# Patient Record
Sex: Male | Born: 1975 | Race: White | Hispanic: No | Marital: Single
Health system: Southern US, Community
[De-identification: ages and names within clinical notes are randomized; demographics above are authoritative.]

## PROBLEM LIST (undated history)

## (undated) DIAGNOSIS — M1990 Unspecified osteoarthritis, unspecified site: Secondary | ICD-10-CM

## (undated) DIAGNOSIS — T7840XA Allergy, unspecified, initial encounter: Secondary | ICD-10-CM

## (undated) DIAGNOSIS — F419 Anxiety disorder, unspecified: Secondary | ICD-10-CM

## (undated) HISTORY — DX: Allergy, unspecified, initial encounter: T78.40XA

## (undated) HISTORY — DX: Anxiety disorder, unspecified: F41.9

## (undated) HISTORY — DX: Unspecified osteoarthritis, unspecified site: M19.90

---

## 2012-09-08 DIAGNOSIS — K4020 Bilateral inguinal hernia, without obstruction or gangrene, not specified as recurrent: Secondary | ICD-10-CM | POA: Insufficient documentation

## 2021-07-29 ENCOUNTER — Emergency Department (HOSPITAL_BASED_OUTPATIENT_CLINIC_OR_DEPARTMENT_OTHER)
Admission: EM | Admit: 2021-07-29 | Discharge: 2021-07-29 | Disposition: A | Discharge status: Home / Self Care | Payer: BC Managed Care – PPO | Attending: Emergency Medicine | Admitting: Emergency Medicine

## 2021-07-29 ENCOUNTER — Emergency Department (HOSPITAL_BASED_OUTPATIENT_CLINIC_OR_DEPARTMENT_OTHER): Payer: BC Managed Care – PPO

## 2021-07-29 ENCOUNTER — Other Ambulatory Visit: Payer: Self-pay

## 2021-07-29 ENCOUNTER — Encounter (HOSPITAL_BASED_OUTPATIENT_CLINIC_OR_DEPARTMENT_OTHER): Payer: Self-pay | Admitting: Emergency Medicine

## 2021-07-29 ENCOUNTER — Emergency Department (HOSPITAL_BASED_OUTPATIENT_CLINIC_OR_DEPARTMENT_OTHER): Payer: BC Managed Care – PPO | Admitting: Radiology

## 2021-07-29 DIAGNOSIS — I808 Phlebitis and thrombophlebitis of other sites: Secondary | ICD-10-CM

## 2021-07-29 DIAGNOSIS — I82712 Chronic embolism and thrombosis of superficial veins of left upper extremity: Secondary | ICD-10-CM | POA: Diagnosis not present

## 2021-07-29 DIAGNOSIS — M79602 Pain in left arm: Secondary | ICD-10-CM | POA: Diagnosis present

## 2021-07-29 NOTE — Discharge Instructions (Signed)
Continue ibuprofen, ice, and heat.  You can utilize compression wraps and elevation when possible.  The area of swelling should improve.  If it does not, you can return at any time. ?

## 2021-07-29 NOTE — ED Provider Notes (Signed)
?[facility] EMERGENCY DEPT ?Provider Note ? ? ?CSN: [id number] ?Arrival date & time: 07/29/21  1253 ? ?  ? ?History ? ?Chief Complaint  ?Patient presents with  ? Arm Pain  ? ? ?Jared Anderson is a 46 y.o. male. ? ? ?Arm Pain ?Patient presents for upper left arm pain.  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  He is in good underlying health.  3 weeks ago, he donated blood in his left AC.  He states that they did have to shift the needle around and there may have been some localized trauma during that time.  1 week ago, he noticed an area of tightness in the medial aspect of his upper left arm.  He has since noticed a area of ropelike swelling that does have associated tenderness.  He denies any fevers or shortness of breath.  He denies any other associated symptoms.  He has been utilizing warm compresses and ibuprofen at home.  He has no history of blood clots and no known family history of blood clots. ? ?  ? ?Home Medications ?Prior to Admission medications   ?Not on File  ?   ? ?Allergies    ?Patient has no known allergies.   ? ?Review of Systems   ?Review of Systems  ?Musculoskeletal:  Positive for myalgias.  ?All other systems reviewed and are negative. ? ?Physical Exam ?Updated Vital Signs ?BP (!) 135/93 (BP Location: Right Arm)   Pulse 67   Temp 97.8 ?F (36.6 ?C) (Oral)   Resp 18   Ht 6\' 2"  (1.88 m)   Wt 77.1 kg   SpO2 100%   BMI 21.83 kg/m?  ?Physical Exam ?Vitals and nursing note reviewed.  ?Constitutional:   ?   General: He is not in acute distress. ?   Appearance: Normal appearance. He is well-developed and normal weight. He is not ill-appearing, toxic-appearing or diaphoretic.  ?HENT:  ?   Head: Normocephalic and atraumatic.  ?   Right Ear: External ear normal.  ?   Left Ear: External ear normal.  ?   Nose: Nose normal.  ?   Mouth/Throat:  ?   Mouth: Mucous membranes are moist.  ?   Pharynx: Oropharynx is clear.  ?Eyes:   ?   Extraocular Movements: Extraocular movements intact.  ?   Conjunctiva/sclera: Conjunctivae normal.  ?Cardiovascular:  ?   Rate and Rhythm: Normal rate and regular rhythm.  ?   Heart sounds: No murmur heard. ?Pulmonary:  ?   Effort: Pulmonary effort is normal. No respiratory distress.  ?Abdominal:  ?   Palpations: Abdomen is soft.  ?   Tenderness: There is no abdominal tenderness.  ?Musculoskeletal:     ?   General: No swelling. Normal range of motion.  ?   Cervical back: Normal range of motion and neck supple. No rigidity.  ?   Right lower leg: No edema.  ?   Left lower leg: No edema.  ?   Comments: Palpable band in medial aspect of upper left arm, consistent with superficial thrombophlebitis.  ?Skin: ?   General: Skin is warm and dry.  ?   Capillary Refill: Capillary refill takes less than 2 seconds.  ?   Coloration: Skin is not jaundiced or pale.  ?Neurological:  ?   General: No focal deficit present.  ?   Mental Status: He is alert and oriented to person, place, and time.  ?   Cranial Nerves: No  cranial nerve deficit.  ?   Sensory: No sensory deficit.  ?   Motor: No weakness.  ?   Coordination: Coordination normal.  ?Psychiatric:     ?   Mood and Affect: Mood normal.     ?   Behavior: Behavior normal.     ?   Thought Content: Thought content normal.     ?   Judgment: Judgment normal.  ? ? ?ED Results / Procedures / Treatments   ?Labs ?(all labs ordered are listed, but only abnormal results are displayed) ?Labs Reviewed - No data to display ? ?EKG ?None ? ?Radiology ?US Venous Img Upper Left (DVT Study) ? ?Result Date: 07/29/2021 ?CLINICAL DATA:  LEFT upper arm swelling and tightness for 1 week with palpable cord at distal upper arm, donated blood from LEFT arm 3 weeks ago, thrombophlebitis question deep venous thrombosis EXAM: LEFT UPPER EXTREMITY VENOUS DOPPLER ULTRASOUND TECHNIQUE: Gray-scale sonography with graded compression, as well as color Doppler and duplex ultrasound were performed to evaluate the  upper extremity deep venous system from the level of the subclavian vein and including the jugular, axillary, basilic, radial, ulnar and upper cephalic vein. Spectral Doppler was utilized to evaluate flow at rest and with distal augmentation maneuvers. COMPARISON:  None FINDINGS: Contralateral Subclavian Vein: Respiratory phasicity is normal and symmetric with the symptomatic side. No evidence of thrombus. Normal compressibility. Internal Jugular Vein: No evidence of thrombus. Normal compressibility, respiratory phasicity and response to augmentation. Subclavian Vein: No evidence of thrombus. Normal compressibility, respiratory phasicity and response to augmentation. Axillary Vein: No evidence of thrombus. Normal compressibility, respiratory phasicity and response to augmentation. Cephalic Vein: No evidence of thrombus. Normal compressibility, respiratory phasicity and response to augmentation. Basilic Vein: No evidence of thrombus. Normal compressibility, respiratory phasicity and response to augmentation. Brachial Veins: No evidence of thrombus. Normal compressibility, respiratory phasicity and response to augmentation. Radial Veins: No evidence of thrombus. Normal compressibility, respiratory phasicity and response to augmentation. Ulnar Veins: No evidence of thrombus. Normal compressibility, respiratory phasicity and response to augmentation. Venous Reflux:  None visualized. Other Findings: A short segment of a superficial vein at the distal LEFT upper arm at the site of clinical concern demonstrates hypoechoic internal echogenicity, absence of spontaneous venous flow and impaired compressibility consistent with superficial thrombophlebitis. IMPRESSION: No evidence of DVT within the LEFT upper extremity. Short segment of superficial thrombophlebitis at the site of clinical concern at the distal LEFT upper arm. Electronically Signed   By: Ulyses Southward M.D.   On: 07/29/2021 16:47  ? ?DG Humerus Left ? ?Result Date:  07/29/2021 ?CLINICAL DATA:  Left arm pain for 1 week. EXAM: LEFT HUMERUS - 2+ VIEW COMPARISON:  None. FINDINGS: Cortical margins of the humerus are intact. There is no evidence of fracture or other focal bone lesions. Shoulder and elbow alignment are maintained. Soft tissues are unremarkable. IMPRESSION: Negative radiographs of the left humerus. Electronically Signed   By: Narda Rutherford M.D.   On: 07/29/2021 15:28   ? ?Procedures ?Procedures  ? ? ?Medications Ordered in ED ?Medications - No data to display ? ?ED Course/ Medical Decision Making/ A&P ?  ?                        ?Medical Decision Making ?Amount and/or Complexity of Data Reviewed ?Radiology: ordered. ? ? ?Patient is a healthy 46 year old male presenting for area of swelling and discomfort in his left upper arm.  First noticed 1 week ago.  3 weeks ago, he did give blood in the wrist and manipulation of his left AC needle causing some localized trauma.  Vital signs are normal on arrival.  Patient is well-appearing.  On exam, he does have a firm bandlike swelling consistent with superficial thrombophlebitis.  There is no overlying change in coloration of his skin.  Area is minimally tender.  He denies any other areas of discomfort or any shortness of breath.  Differential diagnosis includes superficial thrombophlebitis, DVT.  Patient underwent a left upper extremity DVT study.  There is no evidence of DVT.  Findings are consistent with superficial thrombophlebitis.  Due to the length of the thrombophlebitis, which does measure greater than 5 cm, I did discuss options for treatment: Supportive care versus anticoagulation.  Patient states that he currently is at risk for frequent cuts at his place of work.  Patient feels that currently, benefits of anticoagulation would not outweigh the risks.  I feel that this is reasonable.  He was advised to continue supportive care in the form of ice, heat, ibuprofen, elevation, and compression.  He was advised to  return to the ED if he does experience any persistent or worsening symptoms.  He was discharged in good condition. ? ? ? ? ? ? ? ?Final Clinical Impression(s) / ED Diagnoses ?Final diagnoses:  ?Superficial throm

## 2021-07-29 NOTE — ED Triage Notes (Signed)
Left arm pain x 1 week, reports donated blood out same arm . Feels tight upper arm . No further symptoms  ?

## 2022-08-29 DIAGNOSIS — Q431 Hirschsprung's disease: Secondary | ICD-10-CM | POA: Insufficient documentation

## 2022-08-29 DIAGNOSIS — J342 Deviated nasal septum: Secondary | ICD-10-CM | POA: Insufficient documentation

## 2022-08-29 DIAGNOSIS — F419 Anxiety disorder, unspecified: Secondary | ICD-10-CM | POA: Insufficient documentation

## 2023-05-13 DIAGNOSIS — M25551 Pain in right hip: Secondary | ICD-10-CM | POA: Insufficient documentation

## 2023-05-13 DIAGNOSIS — M76891 Other specified enthesopathies of right lower limb, excluding foot: Secondary | ICD-10-CM | POA: Insufficient documentation

## 2023-05-13 DIAGNOSIS — M1611 Unilateral primary osteoarthritis, right hip: Secondary | ICD-10-CM | POA: Insufficient documentation

## 2023-05-21 IMAGING — US US EXTREM  UP VENOUS*L*
1 series · 13 of 24 positions shown · non-contrast
Comparison: None

CLINICAL DATA: LEFT upper arm swelling and tightness for 1 week
with palpable cord at distal upper arm, donated blood from LEFT arm
3 weeks ago, thrombophlebitis question deep venous thrombosis



[Series 1: us venous img upper uni left (dvt) · portal-venous · 13 of 43 slices shown]
[im 1/43]
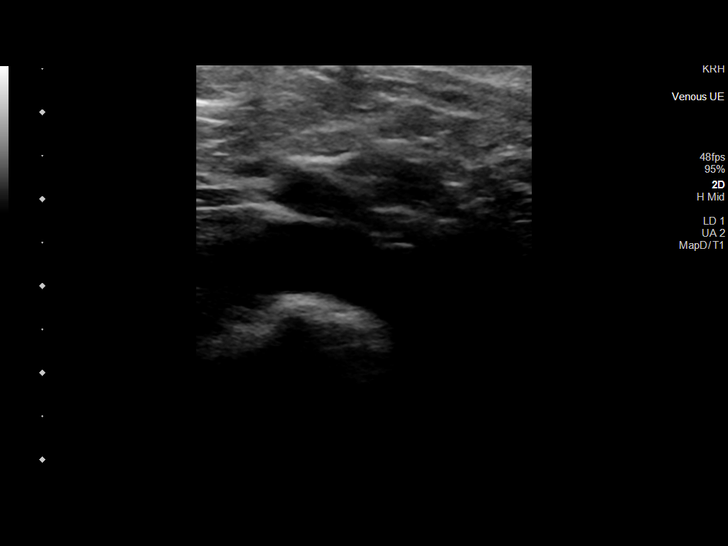
[im 4/43]
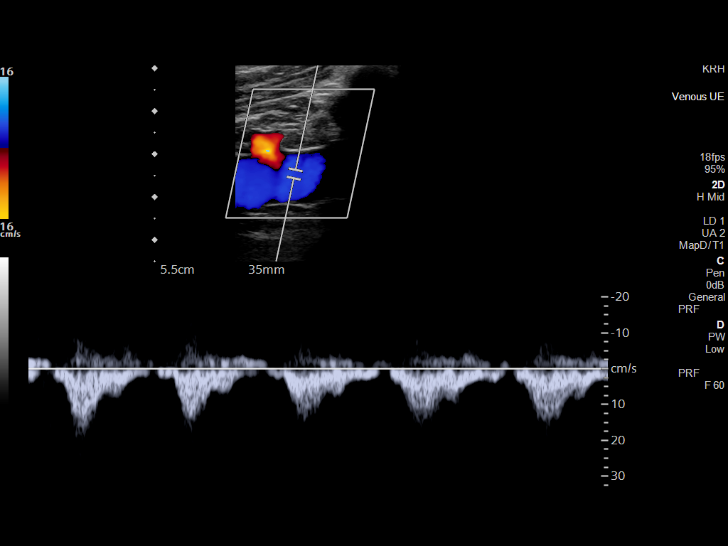
[im 8/43]
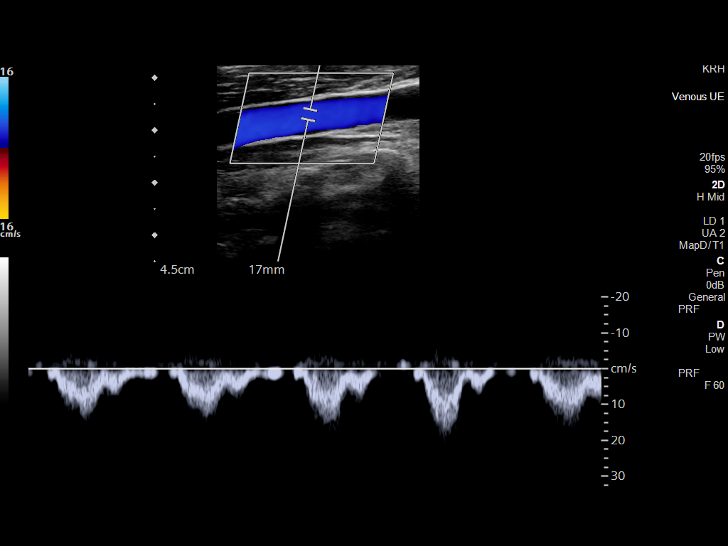
[im 11/43]
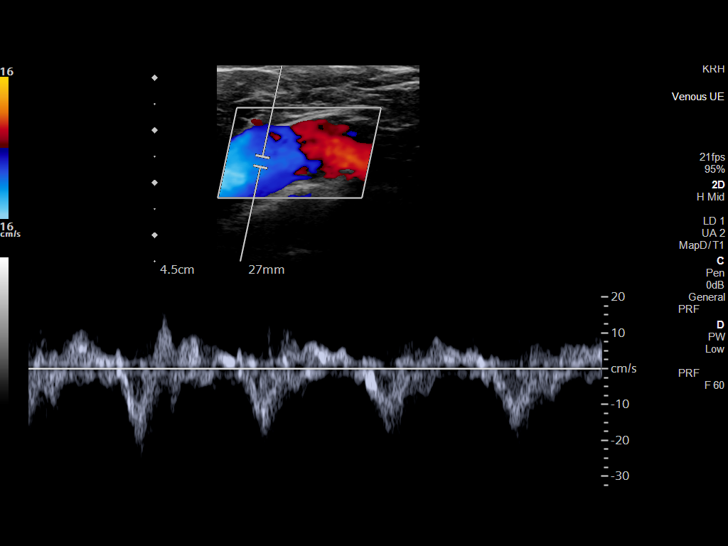
[im 15/43]
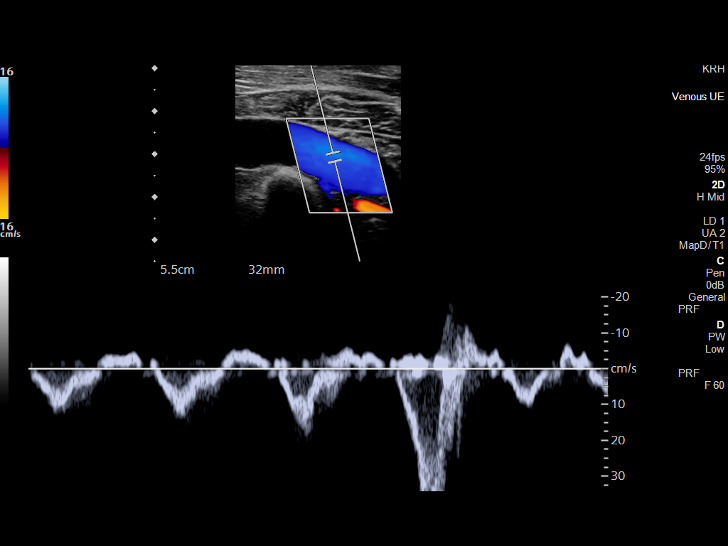
[im 19/43]
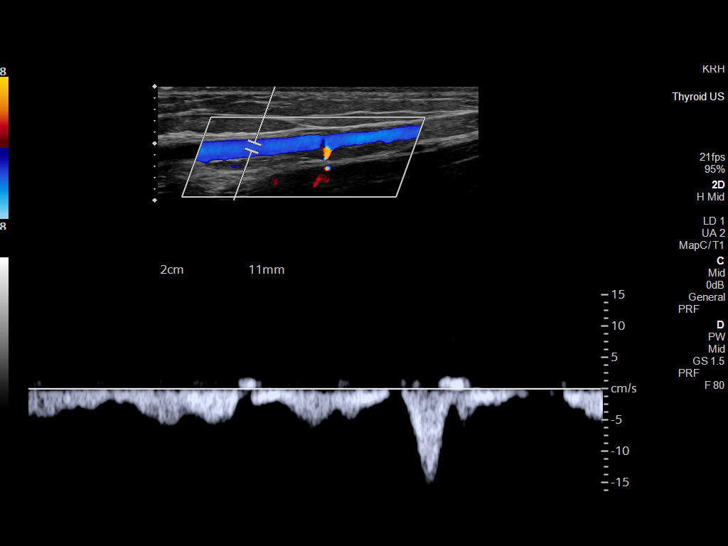
[im 22/43]
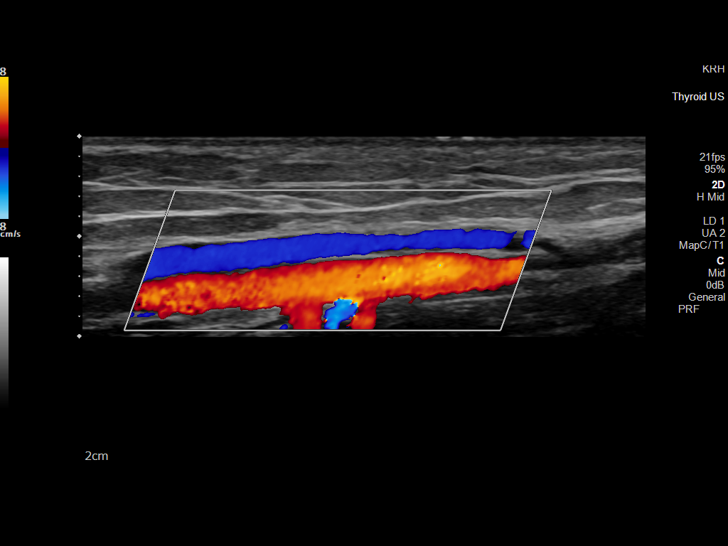
[im 24/43]
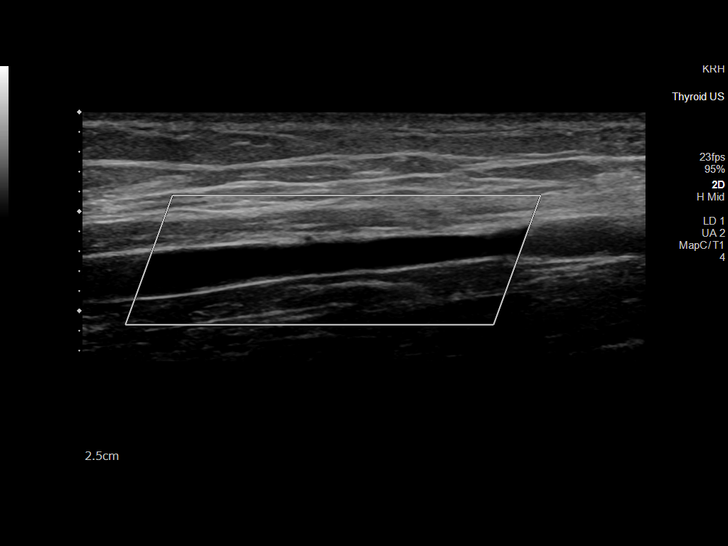
[im 28/43]
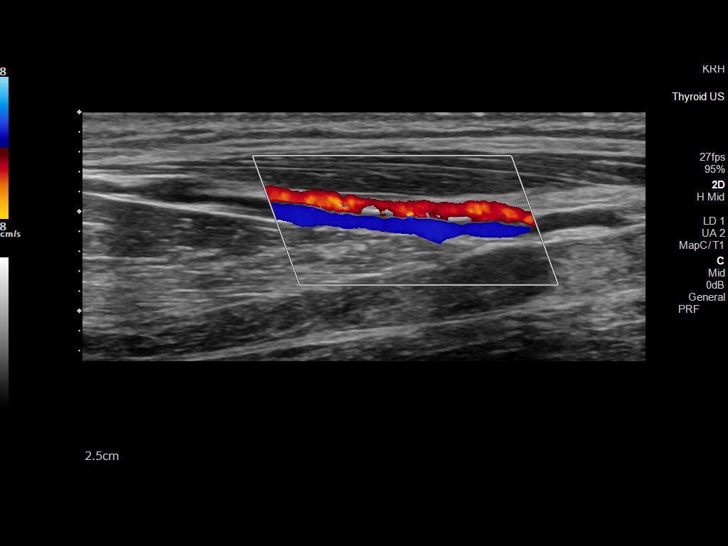
[im 32/43]
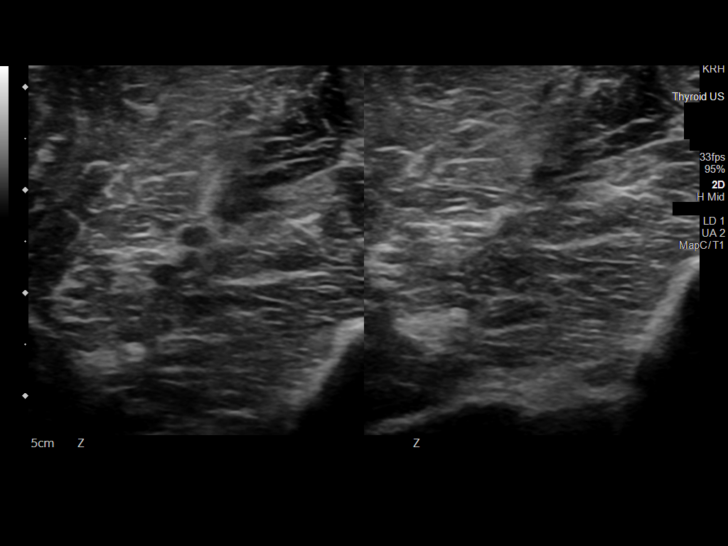
[im 35/43]
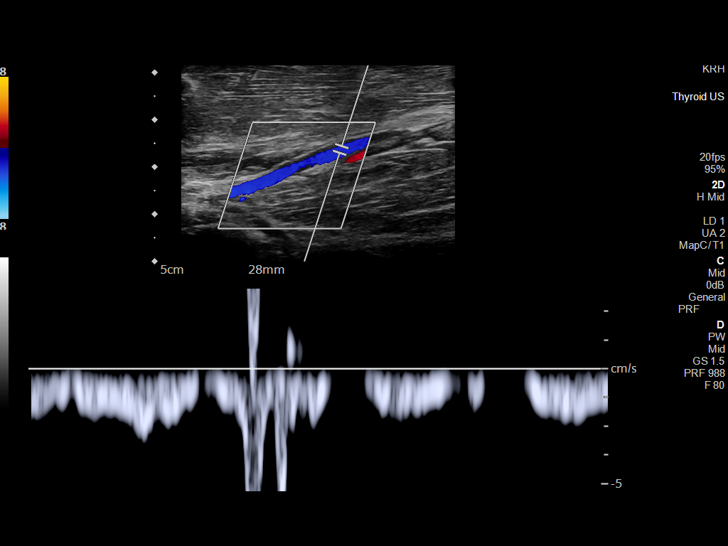
[im 39/43]
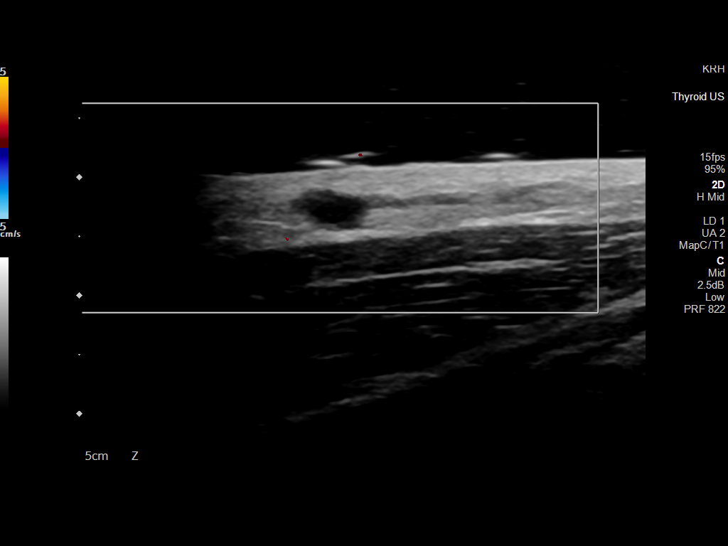
[im 43/43]
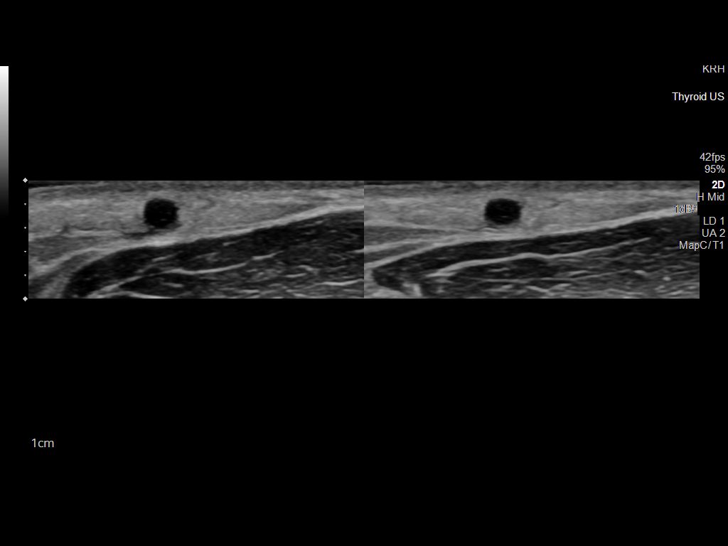

[13 of 24 positions shown; findings below may reference images not displayed]

FINDINGS: Contralateral Subclavian Vein: Respiratory phasicity is normal and
symmetric with the symptomatic side. No evidence of thrombus. Normal
compressibility.

Internal Jugular Vein: No evidence of thrombus. Normal
compressibility, respiratory phasicity and response to augmentation.

Subclavian Vein: No evidence of thrombus. Normal compressibility,
respiratory phasicity and response to augmentation.

Axillary Vein: No evidence of thrombus. Normal compressibility,
respiratory phasicity and response to augmentation.

Cephalic Vein: No evidence of thrombus. Normal compressibility,
respiratory phasicity and response to augmentation.

Basilic Vein: No evidence of thrombus. Normal compressibility,
respiratory phasicity and response to augmentation.

Brachial Veins: No evidence of thrombus. Normal compressibility,
respiratory phasicity and response to augmentation.

Radial Veins: No evidence of thrombus. Normal compressibility,
respiratory phasicity and response to augmentation.

Ulnar Veins: No evidence of thrombus. Normal compressibility,
respiratory phasicity and response to augmentation.

Venous Reflux:  None visualized.

Other Findings: A short segment of a superficial vein at the distal
LEFT upper arm at the site of clinical concern demonstrates
hypoechoic internal echogenicity, absence of spontaneous venous flow
and impaired compressibility consistent with superficial
thrombophlebitis.
IMPRESSION: No evidence of DVT within the LEFT upper extremity.

Short segment of superficial thrombophlebitis at the site of
clinical concern at the distal LEFT upper arm.

## 2023-10-08 ENCOUNTER — Emergency Department (HOSPITAL_BASED_OUTPATIENT_CLINIC_OR_DEPARTMENT_OTHER)
Admission: EM | Admit: 2023-10-08 | Discharge: 2023-10-08 | Disposition: A | Discharge status: Home / Self Care | Payer: Worker's Compensation | Attending: Emergency Medicine | Admitting: Emergency Medicine

## 2023-10-08 ENCOUNTER — Other Ambulatory Visit: Payer: Self-pay

## 2023-10-08 ENCOUNTER — Encounter (HOSPITAL_BASED_OUTPATIENT_CLINIC_OR_DEPARTMENT_OTHER): Payer: Self-pay | Admitting: *Deleted

## 2023-10-08 ENCOUNTER — Emergency Department (HOSPITAL_BASED_OUTPATIENT_CLINIC_OR_DEPARTMENT_OTHER): Payer: Worker's Compensation

## 2023-10-08 DIAGNOSIS — Y990 Civilian activity done for income or pay: Secondary | ICD-10-CM | POA: Diagnosis not present

## 2023-10-08 DIAGNOSIS — M79672 Pain in left foot: Secondary | ICD-10-CM | POA: Diagnosis present

## 2023-10-08 DIAGNOSIS — W208XXA Other cause of strike by thrown, projected or falling object, initial encounter: Secondary | ICD-10-CM | POA: Insufficient documentation

## 2023-10-08 NOTE — ED Triage Notes (Signed)
 Patient to ED POV after 200 lb plate fell on left foot. Redness and swelling noted but no obvious deformity noted. Patient able to bear weight.

## 2023-10-08 NOTE — ED Provider Notes (Signed)
 [place] EMERGENCY DEPARTMENT AT [facility] Provider Note   CSN: [id number] Arrival date & time: 10/08/23  1631     Patient presents with: Foot Injury   Jared Anderson is a 48 y.o. male.    Foot Injury   48 year old male presents emergency department with complaints of left-sided foot pain.  States he was at work when a 200 pound metal sheet ran across the top of his left foot.  States he was wearing metatarsal boots and most of his foot was protected.  Reports redness as well as swelling to the top of his left foot.  Has been able to walk on his left foot without much pain.  States that it does hurt to press over the area where it is red.  Denies any pain/trauma elsewhere.  Presents emergency department for further assessment/evaluation.  No significant pertinent past medical history.  Prior to Admission medications   Not on File    Allergies: Patient has no known allergies.    Review of Systems  All other systems reviewed and are negative.   Updated Vital Signs BP (!) 138/96 (BP Location: Right Arm)   Pulse 79   Temp 97.8 F (36.6 C) (Oral)   Resp 14   SpO2 100%   Physical Exam Vitals and nursing note reviewed.  Constitutional:      General: He is not in acute distress.    Appearance: He is well-developed.  HENT:     Head: Normocephalic and atraumatic.  Eyes:     Conjunctiva/sclera: Conjunctivae normal.  Cardiovascular:     Rate and Rhythm: Normal rate and regular rhythm.     Heart sounds: No murmur heard. Pulmonary:     Effort: Pulmonary effort is normal. No respiratory distress.     Breath sounds: Normal breath sounds.  Abdominal:     Palpations: Abdomen is soft.     Tenderness: There is no abdominal tenderness.  Musculoskeletal:        General: No swelling.     Cervical back: Neck supple.     Comments: Area of erythema appreciated mid dorsal metatarsal left foot.  Area is slightly swollen and tender to the touch.  Full range of motion of  left ankle, digits of left foot.  Pedal and posterior tib pulses 2+ bilaterally.  Patient able to ambulate without obvious gait abnormality, favoring of opposite leg in the room.  No palpable warmth, fluctuance, induration.  Skin:    General: Skin is warm and dry.     Capillary Refill: Capillary refill takes less than 2 seconds.  Neurological:     Mental Status: He is alert.  Psychiatric:        Mood and Affect: Mood normal.     (all labs ordered are listed, but only abnormal results are displayed) Labs Reviewed - No data to display  EKG: None  Radiology: No results found.   Procedures   Medications Ordered in the ED - No data to display                                  Medical Decision Making Amount and/or Complexity of Data Reviewed Radiology: ordered.   This patient presents to the ED for concern of foot , this involves an extensive number of treatment options, and is a complaint that carries with it a high risk of complications and morbidity.  The differential diagnosis includes fracture, strain/sprain, dislocation, ligament/tendon  injury, neurovascular mass, ischemic limb, gout, septic arthritis, other   Co morbidities that complicate the patient evaluation  See HPI   Additional history obtained:  Additional history obtained from EMR External records from outside source obtained and reviewed including hospital record   Lab Tests:  Na/   Imaging Studies ordered:  I ordered imaging studies including x-ray of the foot I independently visualized and interpreted imaging which showed negative I agree with the radiologist interpretation   Cardiac Monitoring: / EKG:  The patient was maintained on a cardiac monitor.  I personally viewed and interpreted the cardiac monitored which showed an underlying rhythm of: Sinus rhythm   Consultations Obtained:  N/a   Problem List / ED Course / Critical interventions / Medication management  Left foot  pain Reevaluation of the patient  showed that the patient stayed the same I have reviewed the patients home medicines and have made adjustments as needed   Social Determinants of Health:  Denies tobacco, cigarettes.   Test / Admission - Considered:  Vitals signs within normal range and stable throughout visit. Imaging studies significant for: See above 48 year old male presents emergency department with complaints of left-sided foot pain.  States he was at work when a 200 pound metal sheet ran across the top of his left foot.  States he was wearing metatarsal boots and most of his foot was protected.  Reports redness as well as swelling to the top of his left foot.  Has been able to walk on his left foot without much pain.  States that it does hurt to press over the area where it is red.  Denies any pain/trauma elsewhere.  Presents emergency department for further assessment/evaluation. On exam, area of minimal swelling, erythema dorsal first metatarsal as above.  No pulse deficits suggest ischemic limb.  No overlying skin changes concerning for secondary infectious process.  No lower extremity edema concerning for DVT.  X-ray obtained by triage staff which was negative for any acute osseous abnormality.  Patient reassured by findings.  Will recommend symptomatic therapy as described in AVS with follow-up with PCP/Ortho in the outpatient setting for reassessment.  Treatment plan discussed with patient and he acknowledged understanding was agreeable to said plan.  Patient overall well-appearing, afebrile in no acute distress. Worrisome signs and symptoms were discussed with the patient, and the patient acknowledged understanding to return to the ED if noticed. Patient was stable upon discharge.       Final diagnoses:  None    ED Discharge Orders     None          Silver Wonda LABOR, [place] 10/08/23 1715    Elnor Bernarda SQUIBB, DO 10/08/23 1933

## 2023-10-08 NOTE — Discharge Instructions (Addendum)
 Your x-ray showed no fracture or dislocation.  You may take Tylenol, ibuprofen for pain as well as ice area of swelling.  Return if you develop any new or worsening symptoms.

## 2024-04-20 ENCOUNTER — Encounter: Payer: Self-pay | Admitting: Student in an Organized Health Care Education/Training Program

## 2024-04-23 ENCOUNTER — Encounter: Payer: Self-pay | Admitting: Student in an Organized Health Care Education/Training Program

## 2024-04-23 ENCOUNTER — Ambulatory Visit (INDEPENDENT_AMBULATORY_CARE_PROVIDER_SITE_OTHER): Admitting: Student in an Organized Health Care Education/Training Program

## 2024-04-23 VITALS — BP 138/85 | HR 69 | Temp 97.9°F | Ht 73.5 in | Wt 182.0 lb

## 2024-04-23 DIAGNOSIS — Z1159 Encounter for screening for other viral diseases: Secondary | ICD-10-CM

## 2024-04-23 DIAGNOSIS — Z0000 Encounter for general adult medical examination without abnormal findings: Secondary | ICD-10-CM | POA: Insufficient documentation

## 2024-04-23 DIAGNOSIS — H6123 Impacted cerumen, bilateral: Secondary | ICD-10-CM | POA: Diagnosis not present

## 2024-04-23 DIAGNOSIS — Z13220 Encounter for screening for lipoid disorders: Secondary | ICD-10-CM

## 2024-04-23 DIAGNOSIS — F1090 Alcohol use, unspecified, uncomplicated: Secondary | ICD-10-CM | POA: Insufficient documentation

## 2024-04-23 DIAGNOSIS — Z131 Encounter for screening for diabetes mellitus: Secondary | ICD-10-CM

## 2024-04-23 DIAGNOSIS — Q431 Hirschsprung's disease: Secondary | ICD-10-CM

## 2024-04-23 DIAGNOSIS — I10 Essential (primary) hypertension: Secondary | ICD-10-CM | POA: Diagnosis not present

## 2024-04-23 DIAGNOSIS — M76891 Other specified enthesopathies of right lower limb, excluding foot: Secondary | ICD-10-CM

## 2024-04-23 DIAGNOSIS — Z114 Encounter for screening for human immunodeficiency virus [HIV]: Secondary | ICD-10-CM

## 2024-04-23 LAB — LIPID PANEL
Cholesterol: 228 mg/dL — ABNORMAL HIGH (ref 28–200)
HDL: 68 mg/dL
LDL Cholesterol: 146 mg/dL — ABNORMAL HIGH (ref 10–99)
NonHDL: 159.56
Total CHOL/HDL Ratio: 3
Triglycerides: 69 mg/dL (ref 10.0–149.0)
VLDL: 13.8 mg/dL (ref 0.0–40.0)

## 2024-04-23 LAB — COMPREHENSIVE METABOLIC PANEL WITH GFR
ALT: 32 U/L (ref 3–53)
AST: 22 U/L (ref 5–37)
Albumin: 4.9 g/dL (ref 3.5–5.2)
Alkaline Phosphatase: 62 U/L (ref 39–117)
BUN: 15 mg/dL (ref 6–23)
CO2: 27 meq/L (ref 19–32)
Calcium: 9.3 mg/dL (ref 8.4–10.5)
Chloride: 103 meq/L (ref 96–112)
Creatinine, Ser: 0.93 mg/dL (ref 0.40–1.50)
GFR: 98.6 mL/min
Glucose, Bld: 85 mg/dL (ref 70–99)
Potassium: 4 meq/L (ref 3.5–5.1)
Sodium: 139 meq/L (ref 135–145)
Total Bilirubin: 2 mg/dL — ABNORMAL HIGH (ref 0.2–1.2)
Total Protein: 7.4 g/dL (ref 6.0–8.3)

## 2024-04-23 LAB — CBC
HCT: 44.4 % (ref 39.0–52.0)
Hemoglobin: 15.4 g/dL (ref 13.0–17.0)
MCHC: 34.7 g/dL (ref 30.0–36.0)
MCV: 94.9 fl (ref 78.0–100.0)
Platelets: 208 K/uL (ref 150.0–400.0)
RBC: 4.68 Mil/uL (ref 4.22–5.81)
RDW: 12.5 % (ref 11.5–15.5)
WBC: 3.4 K/uL — ABNORMAL LOW (ref 4.0–10.5)

## 2024-04-23 LAB — VITAMIN D 25 HYDROXY (VIT D DEFICIENCY, FRACTURES): VITD: 27.42 ng/mL — ABNORMAL LOW (ref 30.00–100.00)

## 2024-04-23 LAB — IBC + FERRITIN
Ferritin: 298.5 ng/mL (ref 22.0–322.0)
Iron: 120 ug/dL (ref 42–165)
Saturation Ratios: 28.7 % (ref 20.0–50.0)
TIBC: 418.6 ug/dL (ref 250.0–450.0)
Transferrin: 299 mg/dL (ref 212.0–360.0)

## 2024-04-23 LAB — HEMOGLOBIN A1C: Hgb A1c MFr Bld: 5.2 % (ref 4.6–6.5)

## 2024-04-23 LAB — VITAMIN B12: Vitamin B-12: 502 pg/mL (ref 211–911)

## 2024-04-23 LAB — MAGNESIUM: Magnesium: 2.3 mg/dL (ref 1.5–2.5)

## 2024-04-23 NOTE — Progress Notes (Signed)
 "  Complete physical exam  Patient: Jared Anderson    DOB: May 10, 1975 49 y.o.   MRN: [id number]  Chief Complaint  Patient presents with   New Patient (Initial Visit)    Patient wants to discuss leg issues.     Subjective:    Jared Anderson is a 49 y.o. male who presents today for a complete physical exam. He reports consuming a general diet. The patient does not participate in regular exercise at present. He generally feels well. He reports sleeping well. He does have additional problems to discuss today.   Discussed the use of AI scribe software for clinical note transcription with the patient, who gave verbal consent to proceed.  History of Present Illness Jared Anderson is a 49 year old male who presents for a yearly physical and evaluation of leg pain.  He experiences leg pain and tightness in the groin area, primarily on the left side, following a work-related trauma about a year ago. He reports that a scan at that time showed mild arthritis and a bone spur in the hip. Despite stretching exercises, he continues to experience muscle twitches and tightness, which he describes as 'like a tight neck' and notes that it sometimes 'pops.' The symptoms have been slowly improving but persist, affecting activities such as hiking, where he experiences fatigue and cramping in the leg.  He has a history of lower back pain, described as soreness, especially when bending and lifting. No shooting pain down to the foot, with discomfort stopping above the knee. He occasionally takes 500 mg of ibuprofen at night to help with sleep, using it once or twice a week as needed.  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 He experiences frequent bowel movements, approximately four to five times a day, and is concerned that he may not be absorbing enough nutrients. He takes multivitamins to supplement his  diet.  He reports a daily alcohol intake of about six drinks, including three beers and three shots of whiskey, and has been drinking at this level for over ten years. He also smokes cigars once or twice a week, primarily in warmer weather, and has a history of anxiety managed without medication currently. He lives with his parents and is saving to buy a house. He moved from [place]  about four to five years ago.   Most recent fall risk assessment:    04/23/2024    9:28 AM  Fall Risk   Falls in the past year? 0  Number falls in past yr: 0  Injury with Fall? 0  Risk for fall due to : No Fall Risks  Follow up Falls evaluation completed     Most recent depression screenings:    04/23/2024    9:28 AM  PHQ 2/9 Scores  PHQ - 2 Score 0    Patient Care Team: Jerrell Cleatus Ned, MD as PCP - General (Internal Medicine)      Objective:    BP 138/85   Pulse 69   Temp 97.9 F (36.6 C) (Oral)   Ht 6' 1.5 (1.867 m)   Wt 182 lb (82.6 kg)   SpO2 100%   BMI 23.69 kg/m   Physical Exam   Gen: Well-appearing man Ears: Ears are impacted with cerumen bilaterally, this was removed with a curette, tympanic membranes are normal Neck: Normal thyroid, no nodules or adenopathy Heart: Regular, no murmur Lungs: Unlabored, clear throughout Abd:  Soft, nontender, large well-healed scar from prior laparotomy, no organomegaly Ext: Warm, no edema MSK: Joints are normal with no effusions, right patella has some maltracking laterally, right and left hips have good range of motion with no tenderness on internal rotation.  There is tenderness over the right adductor muscles. Neuro: Alert, conversational, full strength upper lower extremities, normal gait and balance. Psych: Appropriate mood and affect, not douches or depressed appearing      Assessment & Plan:    Routine Health Maintenance and Physical Exam Immunization History  Administered Date(s) Administered   Td 04/01/2002   Tdap  09/15/2017    Health Maintenance  Topic Date Due   HIV Screening  Never done   Hepatitis C Screening  Never done   COVID-19 Vaccine (1 - 2025-26 season) 05/09/2024 (Originally 12/01/2023)   Influenza Vaccine  06/29/2024 (Originally 10/31/2023)   Hepatitis B Vaccines 19-59 Average Risk (1 of 3 - 19+ 3-dose series) 04/23/2025 (Originally 12/24/1996)   Colonoscopy  04/23/2025 (Originally 12/25/2022)   DTaP/Tdap/Td (3 - Td or Tdap) 09/16/2027   HPV VACCINES (No Doses Required) Completed   Pneumococcal Vaccine  Aged Out   Meningococcal B Vaccine  Aged Out    Discussed health benefits of physical activity, and encouraged him to engage in regular exercise appropriate for his age and condition.  A separate and necessary encounter was done today in addition to the complete physical exam to address right adductor tendinitis and bilaterally impacted ear canals.  Problem List Items Addressed This Visit       High   Hirschsprung's disease (HCC) (Chronic)   He experiences frequent bowel movements and difficulty maintaining weight post-colectomy, with potential vitamin deficiencies. Blood tests for vitamin levels, including iron, B12, and vitamin D, are ordered. He is referred to gastroenterology for medical management of his frequent bowel movements.  Unclear to me the benefit of a screening colonoscopy, depends on how much colon remains and he is can look for those surgical records.  Potential medications to reduce bowel movement frequency were discussed.      Relevant Orders   Comprehensive metabolic panel with GFR   IBC + Ferritin   Vitamin B12   VITAMIN D 25 Hydroxy (Vit-D Deficiency, Fractures)   Magnesium   Ambulatory referral to Gastroenterology   Heavy alcohol use (Chronic)   Chronic alcohol use poses a risk for future health issues.  Currently using about 6 standard drinks per day.  Exam is reassuring with no stigmata of cirrhosis.  Will check labs today to look at the fib 4 score.  He is  advised to reduce alcohol intake to no more than two drinks per day. Naltrexone was discussed for managing cravings if needed.      Relevant Orders   CBC   Comprehensive metabolic panel with GFR     Medium    Hypertension (Chronic)   Blood pressure elevated today consistent with stage I hypertension.  Will check labs today.  Likely impacted by heavy alcohol use which she is gena work on cutting back.  I recommended continuing with lifestyle modifications for now, follow-up in about 3 months and we will recheck his blood pressure.      Adductor tendinitis of right hip   Chronic groin tightness and muscle twitches suggest tendinitis or an overuse injury rather than arthritis. He is referred to physical therapy for evaluation and management. Continue anti-inflammatory medications as needed.      Relevant Orders   Ambulatory referral to Physical Therapy  Low   Health maintenance examination - Primary (Chronic)   Overall pretty healthy individual.  I think his biggest risk factor is his heavy alcohol use which we discussed today.  He is gena work on cutting back and we talked about the use of naltrexone if needed.  History of anxiety but mood is stable right now.  We talked about colon cancer screening, unclear to me how much benefit he will get, depends on the extent of his surgical colectomy to treat the Hirschsprung's disease.  He is going to look for his records about that and have referred him to GI.  We offered vaccinations today.  Talked about healthy diet and exercise.      Bilateral impacted cerumen   Procedure Note: Manual Removal of Impacted Cerumen Using a Curette   Indication:  Cerumen impaction bilaterally preventing assessment of the ear canal and tympanic membrane.  Procedure:  Explained the procedure to the patient and informed consent was obtained  Review patient history for contraindications (e.g., nonintact tympanic membrane, history of ear surgery, anatomical  abnormalities).  After a position of the patient's head upright, I visualized the ear canal and cerumen using an otoscope.  I gently inserted the curette into the ear canal avoiding contact with the canal walls, and carefully scooped the cerumen, removing it in small pieces.  I reassessed the ear canal and tympanic membrane, there was no residual cerumen nor signs of trauma.  Follow-Up:  I instructed the patient to report any persistent symptoms such as pain, discharge, or hearing loss.  Schedule a follow-up appointment if necessary.       Other Visit Diagnoses       Screening for lipid disorders       Relevant Orders   Lipid panel     Screening for diabetes mellitus       Relevant Orders   Hemoglobin A1c     Encounter for HCV screening test for low risk patient       Relevant Orders   Hepatitis C antibody     Screening for HIV (human immunodeficiency virus)       Relevant Orders   HIV Antibody (routine testing w rflx)          Cleatus Debby Specking, MD [facility]    "

## 2024-04-23 NOTE — Assessment & Plan Note (Signed)
 He experiences frequent bowel movements and difficulty maintaining weight post-colectomy, with potential vitamin deficiencies. Blood tests for vitamin levels, including iron, B12, and vitamin D, are ordered. He is referred to gastroenterology for medical management of his frequent bowel movements.  Unclear to me the benefit of a screening colonoscopy, depends on how much colon remains and he is can look for those surgical records.  Potential medications to reduce bowel movement frequency were discussed.

## 2024-04-23 NOTE — Assessment & Plan Note (Signed)
 Overall pretty healthy individual.  I think his biggest risk factor is his heavy alcohol use which we discussed today.  He is gena work on cutting back and we talked about the use of naltrexone if needed.  History of anxiety but mood is stable right now.  We talked about colon cancer screening, unclear to me how much benefit he will get, depends on the extent of his surgical colectomy to treat the Hirschsprung's disease.  He is going to look for his records about that and have referred him to GI.  We offered vaccinations today.  Talked about healthy diet and exercise.

## 2024-04-23 NOTE — Assessment & Plan Note (Signed)
 Chronic groin tightness and muscle twitches suggest tendinitis or an overuse injury rather than arthritis. He is referred to physical therapy for evaluation and management. Continue anti-inflammatory medications as needed.

## 2024-04-23 NOTE — Assessment & Plan Note (Signed)
 Chronic alcohol use poses a risk for future health issues.  Currently using about 6 standard drinks per day.  Exam is reassuring with no stigmata of cirrhosis.  Will check labs today to look at the fib 4 score.  He is advised to reduce alcohol intake to no more than two drinks per day. Naltrexone was discussed for managing cravings if needed.

## 2024-04-23 NOTE — Assessment & Plan Note (Signed)
 Blood pressure elevated today consistent with stage I hypertension.  Will check labs today.  Likely impacted by heavy alcohol use which she is gena work on cutting back.  I recommended continuing with lifestyle modifications for now, follow-up in about 3 months and we will recheck his blood pressure.

## 2024-04-23 NOTE — Patient Instructions (Signed)
" °  VISIT SUMMARY: During your visit, we discussed your leg pain, bowel movement frequency, alcohol use, ear health, and blood pressure. We have developed a plan to address each of these issues and will follow up as needed.  YOUR PLAN: -ADDUCTOR TENDINITIS OF THE THIGH: Adductor tendinitis is inflammation of the tendons in the thigh, often due to overuse. You are referred to physical therapy for evaluation and management. Continue taking anti-inflammatory medications as needed.  -HIRSCHSPRUNG'S DISEASE STATUS POST-COLECTOMY: Hirschsprung's disease is a condition that affects the large intestine and causes problems with passing stool. After your surgery, you experience frequent bowel movements and potential vitamin deficiencies. Blood tests for vitamin levels, including iron, B12, and vitamin D, are ordered. You are referred to gastroenterology for further evaluation and potential medications to reduce bowel movement frequency were discussed.  -ALCOHOL USE DISORDER: Chronic alcohol use can lead to serious health issues. You are advised to reduce your alcohol intake to no more than two drinks per day. We discussed the possibility of using Naltrexone to help manage cravings if needed.  -IMPACTED CERUMEN, BILATERAL: Impacted cerumen is a buildup of earwax that can block the ear canal. Switching to over-the-ear headphones is recommended to prevent further buildup.  -ELEVATED BLOOD PRESSURE: Elevated blood pressure is an early sign of hypertension, which can lead to heart disease. Blood tests for cholesterol levels, kidney function, and diabetes screening are ordered.  INSTRUCTIONS: Please follow up with physical therapy for your adductor tendinitis. Schedule an appointment with gastroenterology for further evaluation of your Hirschsprung's disease. Reduce your alcohol intake to no more than two drinks per day and consider Naltrexone if you need help managing cravings. Switch to over-the-ear headphones to  prevent earwax buildup. Complete the ordered blood tests for vitamin levels, cholesterol, kidney function, and diabetes screening.    Contains text generated by [employer].   "

## 2024-04-23 NOTE — Assessment & Plan Note (Signed)
 Procedure Note: Manual Removal of Impacted Cerumen Using a Curette   Indication:  Cerumen impaction bilaterally preventing assessment of the ear canal and tympanic membrane.  Procedure:  Explained the procedure to the patient and informed consent was obtained  Review patient history for contraindications (e.g., nonintact tympanic membrane, history of ear surgery, anatomical abnormalities).  After a position of the patient's head upright, I visualized the ear canal and cerumen using an otoscope.  I gently inserted the curette into the ear canal avoiding contact with the canal walls, and carefully scooped the cerumen, removing it in small pieces.  I reassessed the ear canal and tympanic membrane, there was no residual cerumen nor signs of trauma.  Follow-Up:  I instructed the patient to report any persistent symptoms such as pain, discharge, or hearing loss.  Schedule a follow-up appointment if necessary.

## 2024-04-24 LAB — HEPATITIS C ANTIBODY: Hepatitis C Ab: NONREACTIVE

## 2024-04-24 LAB — HIV ANTIBODY (ROUTINE TESTING W REFLEX)
HIV 1&2 Ab, 4th Generation: NONREACTIVE
HIV FINAL INTERPRETATION: NEGATIVE

## 2024-04-26 ENCOUNTER — Ambulatory Visit: Payer: Self-pay | Admitting: Student in an Organized Health Care Education/Training Program

## 2024-07-23 ENCOUNTER — Ambulatory Visit: Admitting: Student in an Organized Health Care Education/Training Program

## 2025-04-29 ENCOUNTER — Encounter: Admitting: Student in an Organized Health Care Education/Training Program
